# Patient Record
Sex: Male | Born: 1985 | Hispanic: No | Marital: Married | Smoking: Former smoker
Health system: Southern US, Community
[De-identification: ages and names within clinical notes are randomized; demographics above are authoritative.]

---

## 2014-06-26 ENCOUNTER — Encounter: Payer: Self-pay | Admitting: Family Medicine

## 2014-06-26 ENCOUNTER — Other Ambulatory Visit: Payer: Self-pay | Admitting: Family Medicine

## 2014-06-26 ENCOUNTER — Ambulatory Visit (INDEPENDENT_AMBULATORY_CARE_PROVIDER_SITE_OTHER): Payer: Medicaid Other | Admitting: Family Medicine

## 2014-06-26 VITALS — BP 129/66 | HR 76 | Temp 98.3°F | Ht 69.5 in | Wt 184.1 lb

## 2014-06-26 DIAGNOSIS — N508 Other specified disorders of male genital organs: Secondary | ICD-10-CM

## 2014-06-26 DIAGNOSIS — Z23 Encounter for immunization: Secondary | ICD-10-CM | POA: Diagnosis not present

## 2014-06-26 DIAGNOSIS — N50811 Right testicular pain: Secondary | ICD-10-CM

## 2014-06-26 NOTE — Progress Notes (Signed)
Interpreter utilized during today's visit.  [facility] New [facility] Visit  HPI:  Patient presents to [facility] today for a new patient appointment to establish general primary care, also to discuss abdominal and scrotal pain.  ROS: See HPI  Immigrant Social History: - Date arrived in [place]: November 26th 2015 - Country of origin: [place], translated for [place] and came over on special persons visa - Primary language: arabic  -Requires intepreter (essentially speaks no [place]) - Education: Highest level of education: law degree - Prior work: Clinical research associatelawyer - Best contact name and number: [phone] cell phone - Tobacco/alcohol/drug use: hookah, no alcohol or drugs - Marriage Status: yes - Sexual activity: yes - Were you beaten or tortured in your country or refugee camp?  No   Preventative Care History: -Seen at health department?: yes -records unavailable at this time  Past Medical Hx:  -none  Past Surgical Hx:  -none  Family Hx: updated in Epic - Number of family members: parents and others in [place]  - Number of family members in [place]: Wife and newborn  PHYSICAL EXAM: BP 129/66 mmHg  Pulse 76  Temp(Src) 98.3 F (36.8 C) (Oral)  Ht 5' 9.5" (1.765 m)  Wt 184 lb 1.6 oz (83.507 kg)  BMI 26.81 kg/m2 Gen: healthy young man, NAD HEENT: MMM, NCAT Heart: RRR, no MRG, 2+ dp pulses Lungs: CTAB Abdomen: soft, NTND, no masses Skin:  No rashes Neuro: alert and oriented, no focal deficits Psych: normal mood and behavior GU: Normal penis and left testis, right testis with swelling and distended veins palpable  Examined and interviewed with Dr. Gwendolyn GrantWalden  FOLLOW UP: F/u in 6 month for well person care or sooner as needed. Will call with ultrasound results.

## 2014-06-26 NOTE — Assessment & Plan Note (Signed)
H/o testicular trauma (hit with soccer ball 1 year ago) and constant pain since then - swelling above right testicle and "bag of worms" on exam consistent with varicocele - scrotal ultrasound ordered

## 2014-06-26 NOTE — Patient Instructions (Signed)
Varicocele A varicocele is a swelling of veins in the scrotum (the bag of skin that contains the testicles). It is most common in young men. It occurs most often on the left side. Small or painless varicoceles do not need treatment. Most often, this is not a serious problem, but further tests may be needed to confirm the diagnosis. Surgery may be needed if complications of varicoceles arise. Rarely, varicoceles can reoccur after surgery. CAUSES  The swelling is due to blood backing up in the vein that leads from the testicle back to the body. Blood backs up because the valves inside the vein are not working properly. Veins normally return blood to the heart. Valves in veins are supposed to be one-way valves. They should not allow blood to flow backwards. If the valves do not work well, blood can pool in a vein and make it swell. The same thing happens with varicose veins in the leg. SYMPTOMS  A varicocele most often causes no symptoms. When they occur, symptoms include:   Swelling on one side of the scrotum.  Swelling that is more obvious when standing up.  A lumpy feeling in the scrotum.  Heaviness on one side of the scrotum.  Dull ache in the scrotum, especially after exercise or prolonged standing or sitting.  Slower growth or reduced size of the testicle on the side of the varicocele (in young males).  Problems with fertility can arise if the testicle does not grow normally. DIAGNOSIS  Varicocele is usually diagnosed by a physical exam. Sometimes ultrasonography is done. TREATMENT  Usually, varicoceles need no treatment. They are often routinely monitored on exam by your caregiver to ensure they do not slow the growth of the testicle on that side. Treatment may be needed if:  The varicocele is large.  There is a lot of pain.  The varicocele causes a decrease in the size of the testicle in a growing adolescent.  The other testicle is absent or not normal.  Varicoceles are found on  both sides of the scrotum.  There is pain when exercising.  There are fertility problems. There are two types of treatment:  Surgery. The surgeon ties off the swollen veins. Surgery may be done with an incision in the skin or through a laparoscope. The surgery is usually done in an outpatient setting. Outpatient means there is no overnight stay in a hospital.  Embolization. A small tube is placed in a vein and guided into the swollen veins. X-rays are used to guide the small tube. Tiny metal coils or other blocking items are put through the tube. This blocks swollen veins and the flow of blood. This is usually done in an outpatient setting without the use of general anesthesia. HOME CARE INSTRUCTIONS  To decrease discomfort:  Wear supportive underwear.  Use an athletic supporter for sports.  Only take over-the-counter or prescription medicines for pain or discomfort as directed by your caregiver. SEEK MEDICAL CARE IF:   Pain is increasing.  Swelling does not decrease when lying down.  Testicle is smaller.  The testicle becomes enlarged, swollen, red, or painful. Document Released: 06/02/2000 Document Revised: 05/19/2011 Document Reviewed: 06/06/2009 ExitCare Patient Information 2015 [employer]. This information is not intended to replace advice given to you by your health care provider. Make sure you discuss any questions you have with your health care provider.  

## 2014-06-30 ENCOUNTER — Ambulatory Visit
Admission: RE | Admit: 2014-06-30 | Discharge: 2014-06-30 | Disposition: A | Discharge status: Home / Self Care | Payer: Medicaid Other | Source: Ambulatory Visit | Attending: Family Medicine | Admitting: Family Medicine

## 2014-06-30 DIAGNOSIS — N50811 Right testicular pain: Secondary | ICD-10-CM

## 2014-07-08 ENCOUNTER — Encounter (HOSPITAL_COMMUNITY): Payer: Self-pay | Admitting: *Deleted

## 2014-07-08 ENCOUNTER — Emergency Department (HOSPITAL_COMMUNITY)
Admission: EM | Admit: 2014-07-08 | Discharge: 2014-07-08 | Disposition: A | Discharge status: Home / Self Care | Payer: Medicaid Other | Attending: Emergency Medicine | Admitting: Emergency Medicine

## 2014-07-08 ENCOUNTER — Emergency Department (HOSPITAL_COMMUNITY): Payer: Medicaid Other

## 2014-07-08 DIAGNOSIS — J069 Acute upper respiratory infection, unspecified: Secondary | ICD-10-CM | POA: Insufficient documentation

## 2014-07-08 DIAGNOSIS — R05 Cough: Secondary | ICD-10-CM | POA: Diagnosis present

## 2014-07-08 DIAGNOSIS — M791 Myalgia: Secondary | ICD-10-CM | POA: Diagnosis not present

## 2014-07-08 DIAGNOSIS — R059 Cough, unspecified: Secondary | ICD-10-CM

## 2014-07-08 DIAGNOSIS — Z720 Tobacco use: Secondary | ICD-10-CM | POA: Insufficient documentation

## 2014-07-08 MED ORDER — KETOROLAC TROMETHAMINE 30 MG/ML IJ SOLN
30.0000 mg | Freq: Once | INTRAMUSCULAR | Status: AC
  Administered 2014-07-08: 30 mg via INTRAVENOUS
  Filled 2014-07-08: qty 1

## 2014-07-08 MED ORDER — SODIUM CHLORIDE 0.9 % IV BOLUS (SEPSIS)
1000.0000 mL | Freq: Once | INTRAVENOUS | Status: AC
  Administered 2014-07-08: 1000 mL via INTRAVENOUS

## 2014-07-08 MED ORDER — GUAIFENESIN-CODEINE 100-10 MG/5ML PO SOLN: 5.0000 mL | Freq: Four times a day (QID) | ORAL | Status: AC | PRN

## 2014-07-08 MED ORDER — DEXAMETHASONE SODIUM PHOSPHATE 10 MG/ML IJ SOLN
10.0000 mg | Freq: Once | INTRAMUSCULAR | Status: AC
  Administered 2014-07-08: 10 mg via INTRAVENOUS
  Filled 2014-07-08: qty 1

## 2014-07-08 MED ORDER — PREDNISONE 20 MG PO TABS: 40.0000 mg | ORAL_TABLET | Freq: Every day | ORAL | Status: AC

## 2014-07-08 NOTE — Discharge Instructions (Signed)
Cough, Adult  A cough is a reflex that helps clear your throat and airways. It can help heal the body or may be a reaction to an irritated airway. A cough may only last 2 or 3 weeks (acute) or may last more than 8 weeks (chronic).  CAUSES Acute cough:  Viral or bacterial infections. Chronic cough:  Infections.  Allergies.  Asthma.  Post-nasal drip.  Smoking.  Heartburn or acid reflux.  Some medicines.  Chronic lung problems (COPD).  Cancer. SYMPTOMS   Cough.  Fever.  Chest pain.  Increased breathing rate.  High-pitched whistling sound when breathing (wheezing).  Colored mucus that you cough up (sputum). TREATMENT   A bacterial cough may be treated with antibiotic medicine.  A viral cough must run its course and will not respond to antibiotics.  Your caregiver may recommend other treatments if you have a chronic cough. HOME CARE INSTRUCTIONS   Only take over-the-counter or prescription medicines for pain, discomfort, or fever as directed by your caregiver. Use cough suppressants only as directed by your caregiver.  Use a cold steam vaporizer or humidifier in your bedroom or home to help loosen secretions.  Sleep in a semi-upright position if your cough is worse at night.  Rest as needed.  Stop smoking if you smoke. SEEK IMMEDIATE MEDICAL CARE IF:   You have pus in your sputum.  Your cough starts to worsen.  You cannot control your cough with suppressants and are losing sleep.  You begin coughing up blood.  You have difficulty breathing.  You develop pain which is getting worse or is uncontrolled with medicine.  You have a fever. MAKE SURE YOU:   Understand these instructions.  Will watch your condition.  Will get help right away if you are not doing well or get worse. Document Released: 08/23/2010 Document Revised: 05/19/2011 Document Reviewed: 08/23/2010 [facility] Patient Information 2015 [place], [place]. This information is not intended  to replace advice given to you by your health care provider. Make sure you discuss any questions you have with your health care provider. Codeine; Guaifenesin oral solution or syrup What is this medicine? CODEINE; GUAIFENESIN (KOE deen; gwye FEN e sin) is a cough suppressant and expectorant. It helps to stop or reduce coughing due to the common cold or inhaled irritants. It will not treat an infection. This medicine may be used for other purposes; ask your health care provider or pharmacist if you have questions. COMMON BRAND NAME(S): Antituss AC, Brontex, Cheracol with Codeine, Cheratussin AC, Codefen, Dex-Tuss, Diabetic Tussin C, Duraganidin NR, ExeClear-C, Gani-Tuss NR, Guai Co, Guaiatussin AC, Guaifen C, Guiatuss AC, Halotussin AC, Iophen C-NR, M-Clear WC, Mar-Cof CG, Mytussin AC, Robafen AC, Romilar AC, Tussi-Organidin NR, Tussiden C, Tusso-C, Virtussin AC What should I tell my health care provider before I take this medicine? They need to know if you have any of these conditions: -Addison's disease -convulsions -drug or alcohol abuse or addiction -enlarged prostate -fever -intestinal or stomach problems -kidney disease -liver disease -lung disease like asthma or emphysema -recent surgery or injury -thyroid disease -an allergic or unusual reaction to codeine, hydromorphone, hydrocodone, oxycodone, morphine, guaifenesin, other medicines, foods, dyes, or preservatives -pregnant or trying to get pregnant -breast-feeding How should I use this medicine? Take this medicine by mouth with a full glass of water. Follow the directions on the prescription label. Use a specially marked spoon or container to measure your medicine. Ask your pharmacist if you do not have one. Household spoons are not accurate. Take  this medicine with food or milk if it upsets your stomach. Take your doses at regular times. Do not take more medicine than directed. Talk to your pediatrician regarding the use of this  medicine in children. Special care may be needed. Overdosage: If you think you have taken too much of this medicine contact a poison control center or emergency room at once. NOTE: This medicine is only for you. Do not share this medicine with others. What if I miss a dose? If you miss a dose, take it as soon as you can. If it is almost time for your next dose, take only that dose. Do not take double or extra doses. What may interact with this medicine? -alcohol -antihistamines for allergy, cough and cold -certain medicines for depression, anxiety, or psychotic disturbances -certain medicines for sleep -MAOIs like Carbex, Eldepryl, Marplan, Nardil, and Parnate -muscle relaxants -narcotic medicines (opiates) for pain -tramadol This list may not describe all possible interactions. Give your health care provider a list of all the medicines, herbs, non-prescription drugs, or dietary supplements you use. Also tell them if you smoke, drink alcohol, or use illegal drugs. Some items may interact with your medicine. What should I watch for while using this medicine? You may develop tolerance to this medicine if you take it for a long time. Tolerance means that you will get less cough relief with time. Tell your doctor or health care professional if your symptoms do not improve or if they get worse. If you have a high fever, skin rash, or headache, see your health care professional. Do not suddenly stop taking your medicine because you may develop a severe reaction. Your body becomes used to the medicine. This does NOT mean you are addicted. Addiction is a behavior related to getting and using a drug for a non-medical reason. If your doctor wants you to stop the medicine, the dose will be slowly lowered over time to avoid any side effects. Drink several glasses of water each day. You may get drowsy or dizzy. Do not drive, use machinery, or do anything that needs mental alertness until you know how this  medicine affects you. Do not stand or sit up quickly, especially if you are an older patient. This reduces the risk of dizzy or fainting spells. Alcohol may interfere with the effect of this medicine. Avoid alcoholic drinks. The medicine may cause constipation. Try to have a bowel movement at least every 2 to 3 days. If you do not have a bowel movement for 3 days, call your doctor or health care professional. What side effects may I notice from receiving this medicine? Side effects that you should report to your doctor or health care professional as soon as possible: -allergic reactions like skin rash, itching or hives, swelling of the face, lips, or tongue -breathing problems -confusion -hallucinations -irregular heartbeat -seizures -trouble passing urine Side effects that usually do not require medical attention (report to your doctor or health care professional if they continue or are bothersome): -blurred vision -constipation -flushing or sweating -headache -stomach upset, nausea This list may not describe all possible side effects. Call your doctor for medical advice about side effects. You may report side effects to FDA at 1-800-FDA-1088. Where should I keep my medicine? Keep out of the reach of children. This medicine can be abused. Keep your medicine in a safe place to protect it from theft. Do not share this medicine with anyone. Selling or giving away this medicine is dangerous and against  the law. Store at room temperature between 15 and 30 degrees C (59 and 86 degrees F). Protect from light. Throw away any unused medicine after the expiration date. Discard unused medicine and used packaging carefully. Pets and children can be harmed if they find used or lost packages. NOTE: This sheet is a summary. It may not cover all possible information. If you have questions about this medicine, talk to your doctor, pharmacist, or health care provider.  2015, Elsevier/Gold Standard.  (2010-11-05 15:50:37)

## 2014-07-08 NOTE — ED Notes (Signed)
Pt is in stable condition upon d/c and ambulates from ED. 

## 2014-07-08 NOTE — ED Provider Notes (Signed)
CSN: [id number]     Arrival date & time 07/08/14  [id number] History   First MD Initiated Contact with Patient 07/08/14 1008     Chief Complaint  Patient presents with  . Cough  . Chest Pain     (Consider location/radiation/quality/duration/timing/severity/associated sxs/prior Treatment) Patient is a 29 y.o. male presenting with cough and chest pain. The history is provided by the patient.  Cough Cough characteristics:  Productive, harsh and vomit-inducing Sputum characteristics:  Clear Severity:  Severe Duration:  1 week Timing:  Intermittent Progression:  Worsening Chronicity:  New Smoker: no   Context: upper respiratory infection   Context: not animal exposure, not exposure to allergens, not fumes, not occupational exposure, not sick contacts, not smoke exposure, not weather changes and not with activity   Relieved by:  None tried Worsened by:  Deep breathing and activity Ineffective treatments:  None tried Associated symptoms: chest pain, chills, fever, myalgias and wheezing   Associated symptoms: no diaphoresis, no ear fullness, no ear pain, no eye discharge, no headaches, no rash, no rhinorrhea, no shortness of breath, no sinus congestion, no sore throat and no weight loss   Risk factors: no recent travel   Chest Pain Associated symptoms: cough and fever   Associated symptoms: no diaphoresis, no headache and no shortness of breath     History reviewed. No pertinent past medical history. History reviewed. No pertinent past surgical history. No family history on file. History  Substance Use Topics  . Smoking status: Current Some Day Smoker  . Smokeless tobacco: Never Used  . Alcohol Use: Not on file    Review of Systems  Constitutional: Positive for fever and chills. Negative for weight loss and diaphoresis.  HENT: Negative for ear pain, rhinorrhea and sore throat.   Eyes: Negative for discharge.  Respiratory: Positive for cough and wheezing. Negative for shortness of  breath.   Cardiovascular: Positive for chest pain.  Musculoskeletal: Positive for myalgias.  Skin: Negative for rash.  Neurological: Negative for headaches.  All other systems reviewed and are negative.     Allergies  Review of patient's allergies indicates no known allergies.  Home Medications   Prior to Admission medications   Not on File   BP 139/79 mmHg  Pulse 121  Temp(Src) 98.5 F (36.9 C) (Oral)  Resp 18  SpO2 95% Physical Exam  Constitutional: He appears well-developed and well-nourished. No distress.  HENT:  Head: Normocephalic and atraumatic.  Mouth/Throat: Oropharynx is clear and moist.  Eyes: Conjunctivae are normal. No scleral icterus.  Neck: Normal range of motion. Neck supple.  Cardiovascular: Normal rate, regular rhythm and normal heart sounds.   Pulmonary/Chest: Effort normal. No respiratory distress. He has no wheezes.  Ronchi in all lung fields Clear with cough  Abdominal: Soft. There is no tenderness.  Musculoskeletal: He exhibits no edema.  Neurological: He is alert.  Skin: Skin is warm. No rash noted. He is not diaphoretic.  Warm, clammy  Psychiatric: His behavior is normal.  Nursing note and vitals reviewed.   ED Course  Procedures (including critical care time) Labs Review Labs Reviewed - No data to display  Imaging Review No results found.   EKG Interpretation   Date/Time:  Saturday July 08 2014 10:07:07 EDT Ventricular Rate:  116 PR Interval:  120 QRS Duration: 82 QT Interval:  306 QTC Calculation: 425 R Axis:   94 Text Interpretation:  Sinus tachycardia Rightward axis Borderline ECG No  old tracing to compare Confirmed by [facility]  MD,  TREY ([id number]) on 07/08/2014  10:22:32 AM      MDM   Final diagnoses:  Cough     Pt CXR negative for acute infiltrate. Patients symptoms are consistent with URI, likely viral etiology. Discussed that antibiotics are not indicated for viral infections. Pt will be discharged with  symptomatic treatment.  Verbalizes understanding and is agreeable with plan. Pt is hemodynamically stable & in NAD prior to dc.   Arthor Captain, PA-C 07/08/14 1141  Blake Divine, MD 07/08/14 1327

## 2014-07-08 NOTE — ED Notes (Signed)
Pt tates tht he has had a productive cough and sore throat for 1 week.

## 2014-07-19 ENCOUNTER — Ambulatory Visit (INDEPENDENT_AMBULATORY_CARE_PROVIDER_SITE_OTHER): Payer: Medicaid Other | Admitting: Family Medicine

## 2014-07-19 ENCOUNTER — Encounter: Payer: Self-pay | Admitting: Family Medicine

## 2014-07-19 VITALS — BP 127/87 | HR 101 | Temp 98.5°F | Ht 69.5 in | Wt 177.0 lb

## 2014-07-19 DIAGNOSIS — N50811 Right testicular pain: Secondary | ICD-10-CM

## 2014-07-19 DIAGNOSIS — N508 Other specified disorders of male genital organs: Secondary | ICD-10-CM | POA: Diagnosis not present

## 2014-07-19 DIAGNOSIS — R05 Cough: Secondary | ICD-10-CM | POA: Diagnosis present

## 2014-07-19 DIAGNOSIS — R059 Cough, unspecified: Secondary | ICD-10-CM

## 2014-07-19 MED ORDER — ALBUTEROL SULFATE (2.5 MG/3ML) 0.083% IN NEBU
2.5000 mg | INHALATION_SOLUTION | Freq: Once | RESPIRATORY_TRACT | Status: AC
  Administered 2014-07-19: 2.5 mg via RESPIRATORY_TRACT

## 2014-07-19 MED ORDER — AZITHROMYCIN 250 MG PO TABS: ORAL_TABLET | ORAL | Status: AC

## 2014-07-19 MED ORDER — ALBUTEROL SULFATE HFA 108 (90 BASE) MCG/ACT IN AERS: 2.0000 | INHALATION_SPRAY | RESPIRATORY_TRACT | Status: AC | PRN

## 2014-07-19 NOTE — Assessment & Plan Note (Signed)
Continued R testicular pain Small varicocele on US Discussed likely no surgical intervention needed but will refer to urology given symptoms.

## 2014-07-19 NOTE — Patient Instructions (Signed)
Please go get your x rasy today  Try the inhaler, 2 puffs every 4 hours for the next 2 days then every 4 hours as needed for coughing or shortness of breath  Be sure to take all of the antibiotics

## 2014-07-19 NOTE — Assessment & Plan Note (Signed)
Cough and dyspnea with malasie for 1 month or so Previous CXR with atelectasis, lung exam today witgh scattered exp wheezes and some courase breath sounds Treat with azithro

## 2014-07-19 NOTE — Progress Notes (Signed)
One of the available back up preceptor.

## 2014-07-19 NOTE — Progress Notes (Signed)
Patient ID: Alexander Stark, male   DOB: 1985-11-09, 29 y.o.   MRN: [id number]   The entirety of the visit was conducted using an person Arabic interpreter. The patient has immigrated from [place] 6 months ago.  HPI  Patient presents today for cough and testicular pain  Cough Patient explains via Arabic interpreter that he has had cough and shortness of breath over the last month. He also describes intermittent malaise and fatigue. His normal appetite. He's not noticed any overt wheezing. His cough is productive of yellow sputum. He was seen in the ER and found to have a chest x-ray with left-sided atelectasis And he was treated with prednisone and Robitussin-AC. He did not have any improvement with these treatments.  Testicular pain Seen  previously for this which was suspected to be varicocele on exam. Sent for an ultrasound and would like a referral to urology as he is continuing to have pain. US shows small varicocele.    PMH- Former smoker ROS: Per HPI  Objective: BP 127/87 mmHg  Pulse 101  Temp(Src) 98.5 F (36.9 C) (Oral)  Ht 5' 9.5" (1.765 m)  Wt 177 lb (80.287 kg)  BMI 25.77 kg/m2 Gen: NAD, alert, cooperative with exam HEENT: NCAT CV: RRR, good S1/S2, no murmur Resp:Scattered expiratory wheezes, coarse breath sounds throughout the right greater than left. non-labored Ext: No edema, warm Neuro: Alert and oriented, No gross deficits  After nebulizer air movement has improved, coarse breath sounds throughout, still nonlabored  Assessment and plan:  Cough Cough and dyspnea with malasie for 1 month or so Previous CXR with atelectasis, lung exam today witgh scattered exp wheezes and some courase breath sounds Treat with azithro   Testicular pain, right Continued R testicular pain Small varicocele on US Discussed likely no surgical intervention needed but will refer to urology given symptoms.     Orders Placed This Encounter  Procedures  . DG Chest 2 View    Standing  Status: Future     Number of Occurrences:      Standing Expiration Date: 09/18/2015    Order Specific Question:  Reason for Exam (SYMPTOM  OR DIAGNOSIS REQUIRED)    Answer:  cough, dyspnea    Order Specific Question:  Preferred imaging location?    Answer:  [facility]  . Ambulatory referral to Urology    Referral Priority:  Routine    Referral Type:  Consultation    Referral Reason:  Specialty Services Required    Requested Specialty:  Urology    Number of Visits Requested:  1

## 2014-07-25 ENCOUNTER — Encounter: Payer: Self-pay | Admitting: *Deleted

## 2014-08-23 ENCOUNTER — Ambulatory Visit: Payer: Medicaid Other | Admitting: Family Medicine

## 2015-08-13 ENCOUNTER — Encounter: Payer: Self-pay | Admitting: Family Medicine

## 2015-08-13 ENCOUNTER — Ambulatory Visit (INDEPENDENT_AMBULATORY_CARE_PROVIDER_SITE_OTHER): Payer: Medicaid Other | Admitting: Family Medicine

## 2015-08-13 VITALS — BP 132/77 | HR 68 | Temp 98.1°F | Wt 175.0 lb

## 2015-08-13 DIAGNOSIS — R21 Rash and other nonspecific skin eruption: Secondary | ICD-10-CM

## 2015-08-13 DIAGNOSIS — N489 Disorder of penis, unspecified: Secondary | ICD-10-CM | POA: Diagnosis not present

## 2015-08-13 MED ORDER — KETOCONAZOLE 2 % EX CREA: 1.0000 "application " | TOPICAL_CREAM | Freq: Two times a day (BID) | CUTANEOUS | Status: AC

## 2015-08-13 NOTE — Progress Notes (Signed)
   Subjective:    Patient ID: Alexander Stark, male    DOB: 01-Mar-1986, 30 y.o.   MRN: [id number]  HPI  Patient presents for Same Day Appointment  CC: rash on penis  # Rash:  First noted 5 months ago  Rash occurs on the right side, it is always on this side but sometimes spreads up toward the head of the penis.   Says the first time this happened he noticed it was because he had a lot of friction during sex that seemed to cause a lot of irritation.   Rash will come right after intercourse, last about a week, and then gets better. hwoever says in last 5 months has not resolved completely, has not abstained from sex.  The rash is primarily red and toward the end the skin flakes off.  Denies any vesicles or fluid filled areas.   Denies history of STD, denies new partners, not suspicion for wife infidelity ROS: no penile discharge, no joint aches or pains, no testicular pain, no   Social Hx: former smoker  Review of Systems   See HPI for ROS.   Past medical history, surgical, family, and social history reviewed and updated in the EMR as appropriate.  Objective:  BP 132/77 mmHg  Pulse 68  Temp(Src) 98.1 F (36.7 C) (Oral)  Wt 175 lb (79.379 kg) Vitals and nursing note reviewed  General: no apparent distress  GU: no inguinal lymphadenopathy. On the right side of shaft of penis there is some mild irritated skin with mild desquamation at edge; there is some appearance of resolving rash on the right side of the head of the penis as well. There is no scrotal mass or deformity, no epidymitis tenderness  Assessment & Plan:  1. Penile rash Story not consistent with STD/herpes outbreaks. He declines testing today, which I feel is reasonable as I am not as suspicious for this. It sounds like he may be having friction rash from sex vs possible candidal infection (though feel this is not as likely). I recommended he buy OTC lubricating gel and use this for every episode of intercourse to  see if it helps. If not, can do a trial of ketoconazole cream. I also asked him to return to clinic if the above is not helping to evaluate the rash as it is starting.    Return if symptoms worsen or fail to improve.

## 2015-08-13 NOTE — Patient Instructions (Signed)
Buy lubricating gel to use each time you have sex. Make sure you use plenty of this, if you start to feel dryness you need to add more.  If you are still getting the rash, use the antifungal cream (ketoconazole) -- I would like you to come to the clinic when the rash starts so that we can look at a skin scraping under the microscope.

## 2017-01-20 IMAGING — US US ART/VEN ABD/PELV/SCROTUM DOPPLER LTD
1 series · 14 of 25 positions shown · non-contrast
Comparison: None

CLINICAL DATA: Right-sided testicular pain and swelling for the
past 18 months; no known injury and no history of surgery.

EXAM:
SCROTAL ULTRASOUND
DOPPLER ULTRASOUND OF THE TESTICLES
TECHNIQUE: Complete ultrasound examination of the testicles, epididymis, and
other scrotal structures was performed. Color and spectral Doppler
ultrasound were also utilized to evaluate blood flow to the
testicles.

[Series 1: us art/ven abd/pelv/scrotum doppler ltd · 0.06mm/px · 14 of 58 slices shown]
[im 1/58]
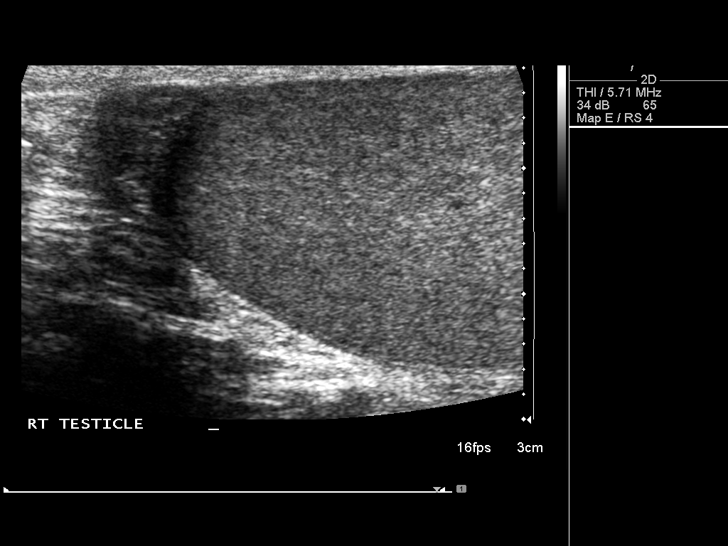
[im 5/58]
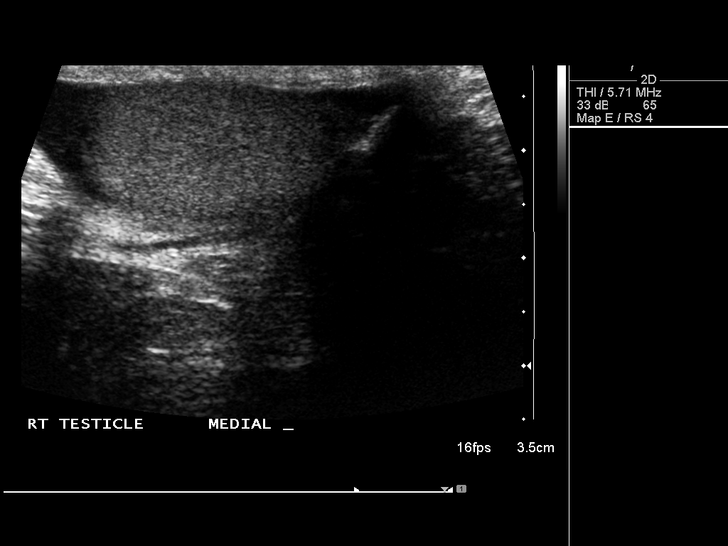
[im 10/58]
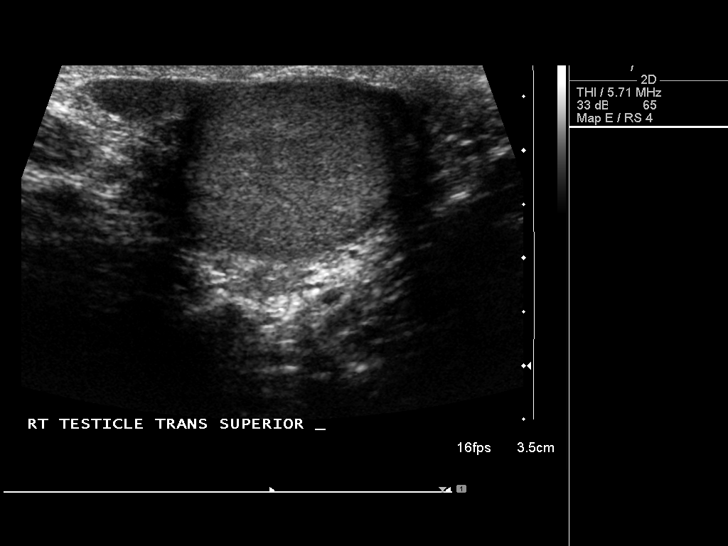
[im 15/58]
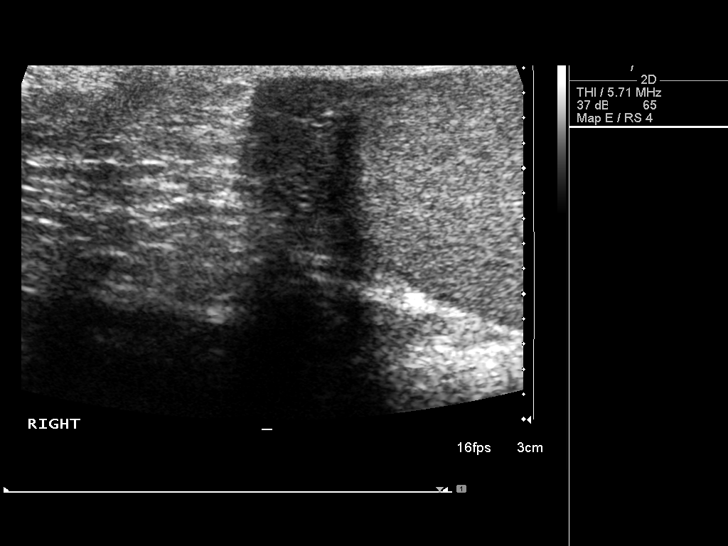
[im 20/58]
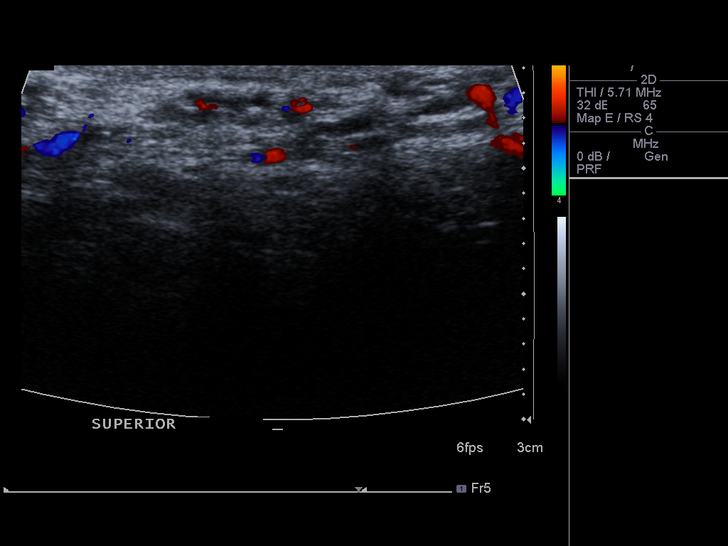
[im 22/58]
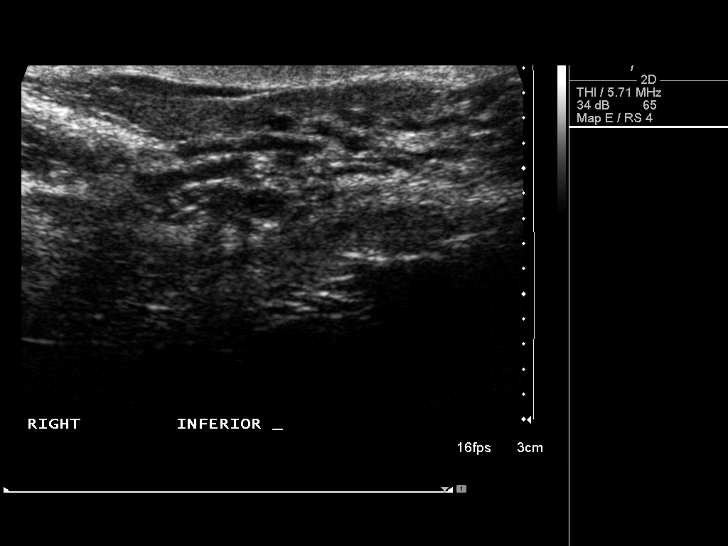
[im 27/58]
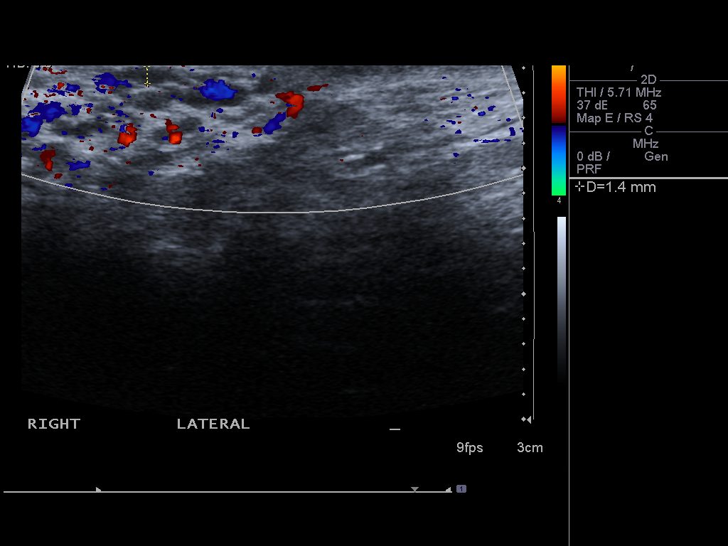
[im 31/58]
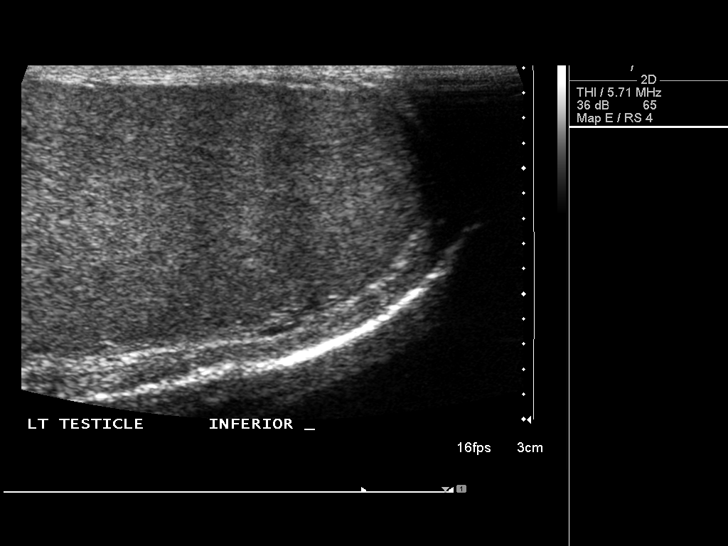
[im 36/58]
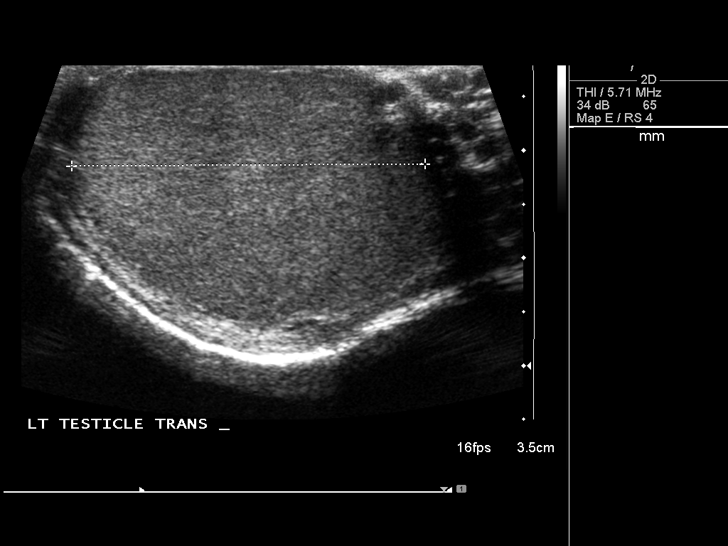
[im 39/58]
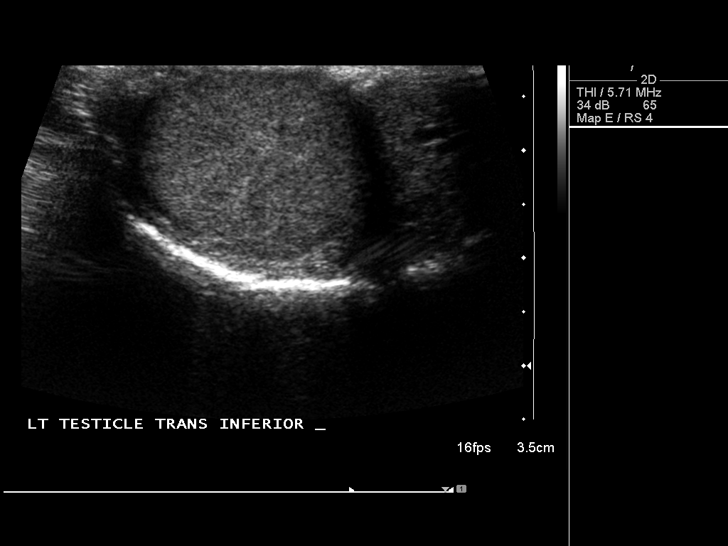
[im 43/58]
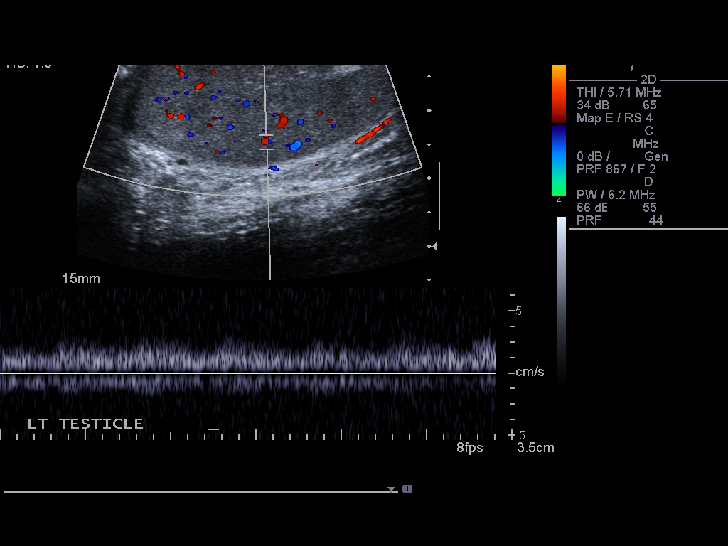
[im 48/58]
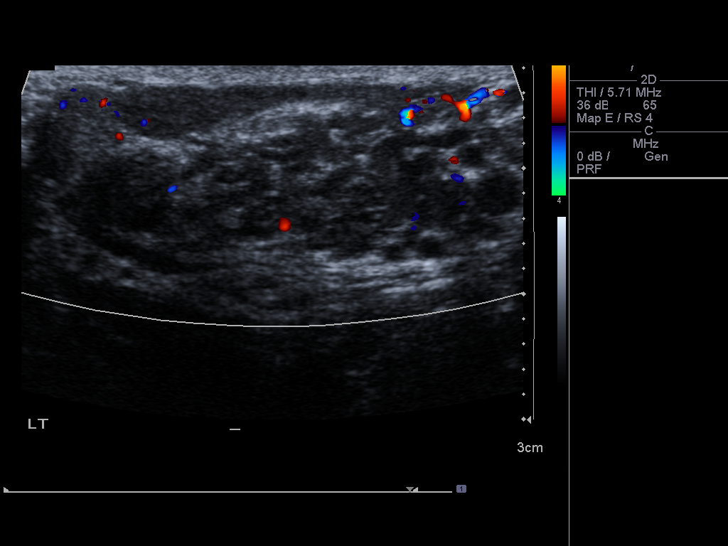
[im 53/58]
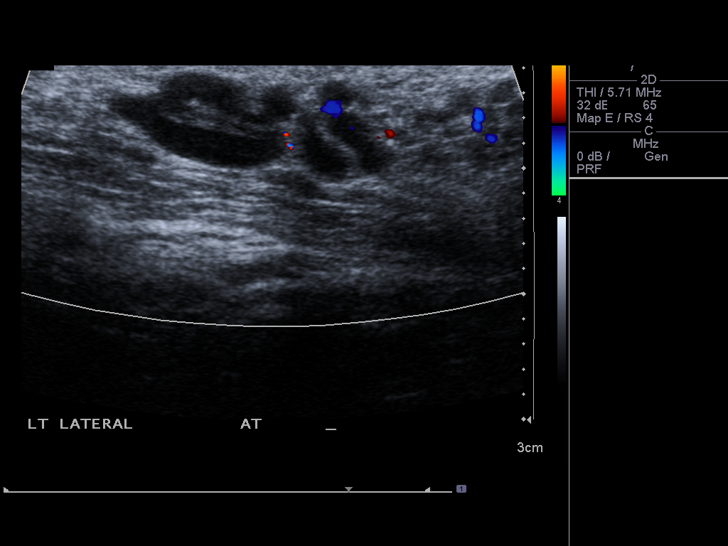
[im 58/58]
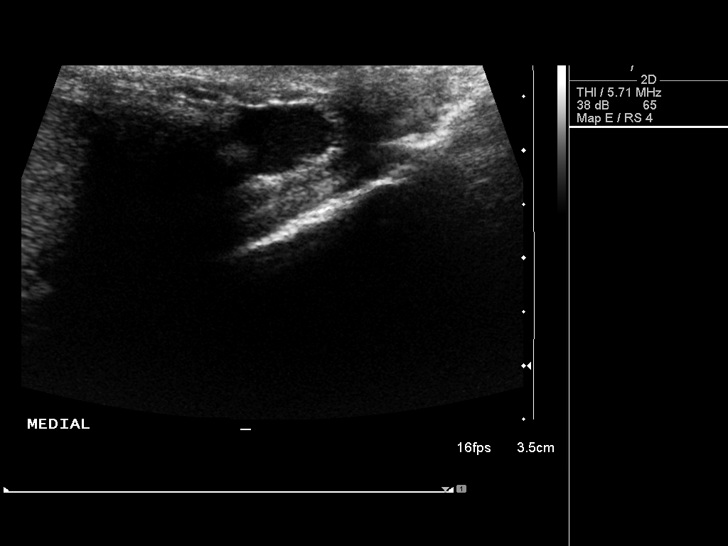

[14 of 25 positions shown; findings below may reference images not displayed]

FINDINGS: Right testicle

Measurements: 4.8 x 2.2 x 3.1 cm.. No mass or microlithiasis
visualized.

Left testicle

Measurements: 4.7 x 2.1 x 3.3 cm.. No mass or microlithiasis
visualized.

Right epididymis:  Normal in size and appearance.

Left epididymis:  Normal in size and appearance.

Hydrocele:  None visualized.

Varicocele:  There is a small left-sided varicocele.

Pulsed Doppler interrogation of both testes demonstrates normal low
resistance arterial and venous waveforms bilaterally.
IMPRESSION: 1. The testes exhibit no masses, inflammatory changes, or vascular
abnormality.
2. The epididymal structures are within the limits of normal.
3. There is a small left-sided varicocele.

## 2020-09-23 ENCOUNTER — Encounter (HOSPITAL_COMMUNITY): Payer: Self-pay

## 2020-09-23 ENCOUNTER — Ambulatory Visit (HOSPITAL_COMMUNITY)
Admission: EM | Admit: 2020-09-23 | Discharge: 2020-09-23 | Disposition: A | Discharge status: Home / Self Care | Payer: BC Managed Care – PPO | Attending: Family Medicine | Admitting: Family Medicine

## 2020-09-23 ENCOUNTER — Other Ambulatory Visit: Payer: Self-pay

## 2020-09-23 DIAGNOSIS — R509 Fever, unspecified: Secondary | ICD-10-CM | POA: Diagnosis not present

## 2020-09-23 DIAGNOSIS — R059 Cough, unspecified: Secondary | ICD-10-CM | POA: Diagnosis not present

## 2020-09-23 DIAGNOSIS — R110 Nausea: Secondary | ICD-10-CM

## 2020-09-23 LAB — POC INFLUENZA A AND B ANTIGEN (URGENT CARE ONLY)
INFLUENZA A ANTIGEN, POC: NEGATIVE
INFLUENZA B ANTIGEN, POC: NEGATIVE

## 2020-09-23 MED ORDER — PROMETHAZINE-DM 6.25-15 MG/5ML PO SYRP: 5.0000 mL | ORAL_SOLUTION | Freq: Four times a day (QID) | ORAL | 0 refills | Status: AC | PRN

## 2020-09-23 MED ORDER — ONDANSETRON 4 MG PO TBDP: 4.0000 mg | ORAL_TABLET | Freq: Three times a day (TID) | ORAL | 0 refills | Status: AC | PRN

## 2020-09-23 MED ORDER — ACETAMINOPHEN 325 MG PO TABS
650.0000 mg | ORAL_TABLET | Freq: Once | ORAL | Status: AC
  Administered 2020-09-23: 650 mg via ORAL

## 2020-09-23 MED ORDER — ACETAMINOPHEN 325 MG PO TABS
ORAL_TABLET | ORAL | Status: AC
  Filled 2020-09-23: qty 2

## 2020-09-23 NOTE — ED Triage Notes (Signed)
Pt presents with fever, vomiting, SOB, headache since yesterday.   States he has been unable to eat or drink. States when he coughs he feels SOB.
